# Patient Record
Sex: Male | Born: 1950 | Race: Asian | Hispanic: No | Marital: Married | Smoking: Current every day smoker
Health system: Southern US, Community
[De-identification: ages and names within clinical notes are randomized; demographics above are authoritative.]

## PROBLEM LIST (undated history)

## (undated) DIAGNOSIS — E119 Type 2 diabetes mellitus without complications: Secondary | ICD-10-CM

## (undated) DIAGNOSIS — I10 Essential (primary) hypertension: Secondary | ICD-10-CM

## (undated) DIAGNOSIS — E785 Hyperlipidemia, unspecified: Secondary | ICD-10-CM

## (undated) HISTORY — DX: Hyperlipidemia, unspecified: E78.5

## (undated) HISTORY — DX: Essential (primary) hypertension: I10

---

## 2012-12-13 ENCOUNTER — Other Ambulatory Visit: Payer: Self-pay | Admitting: Infectious Diseases

## 2012-12-13 ENCOUNTER — Ambulatory Visit
Admission: RE | Admit: 2012-12-13 | Discharge: 2012-12-13 | Disposition: A | Discharge status: Home / Self Care | Payer: No Typology Code available for payment source | Source: Ambulatory Visit | Attending: Infectious Diseases | Admitting: Infectious Diseases

## 2012-12-13 DIAGNOSIS — A150 Tuberculosis of lung: Secondary | ICD-10-CM

## 2013-01-10 ENCOUNTER — Ambulatory Visit: Payer: Self-pay | Admitting: Family Medicine

## 2013-01-10 VITALS — BP 148/88 | HR 52 | Temp 97.9°F | Resp 18 | Ht 68.5 in | Wt 168.4 lb

## 2013-01-10 DIAGNOSIS — I10 Essential (primary) hypertension: Secondary | ICD-10-CM

## 2013-01-10 DIAGNOSIS — R51 Headache: Secondary | ICD-10-CM

## 2013-01-10 DIAGNOSIS — E7800 Pure hypercholesterolemia, unspecified: Secondary | ICD-10-CM

## 2013-01-10 LAB — LIPID PANEL
LDL Cholesterol: 163 mg/dL — ABNORMAL HIGH (ref 0–99)
Total CHOL/HDL Ratio: 5.1 Ratio
VLDL: 35 mg/dL (ref 0–40)

## 2013-01-10 LAB — POCT CBC
Granulocyte percent: 47.6 %G (ref 37–80)
HCT, POC: 45.5 % (ref 43.5–53.7)
MCV: 92.8 fL (ref 80–97)
POC LYMPH PERCENT: 42.6 %L (ref 10–50)
RDW, POC: 13.6 %

## 2013-01-10 LAB — BASIC METABOLIC PANEL
CO2: 25 mEq/L (ref 19–32)
Chloride: 105 mEq/L (ref 96–112)
Glucose, Bld: 122 mg/dL — ABNORMAL HIGH (ref 70–99)
Potassium: 4.2 mEq/L (ref 3.5–5.3)
Sodium: 138 mEq/L (ref 135–145)

## 2013-01-10 NOTE — Patient Instructions (Addendum)
I will be in touch with your labs.  Your blood sugar is a little bit elevated today. Let's check this again in about 6 months.    If you need cholesterol medication again I will prescribe it for you.

## 2013-01-10 NOTE — Progress Notes (Signed)
[facility] [address] [place] [address] [phone]  Date:  01/10/2013   Name:  Luis Coleman   DOB:  10-11-1950   MRN:  [id number]  PCP:  No PCP Per Patient    Chief Complaint: Headache, Dizziness and Blurred Vision   History of Present Illness:  Luis Coleman is a 62 y.o. very pleasant male patient who presents with the following:  Here today as a new patient.  History of HTN and high cholesterol.  He has noted HA, dizziness and blurred vision for about 2 weeks.  He notes the blurred vision "just sometimes," he "has not really paid attention to it."   He notes the HA for a couple of minutes a day, it may occur every couple of days.  The dizziness seems to occur along with his headaches, and only lasts a couple of minutes.  No syncope.  He thinks these sx may be due to high cholesterol, as when he noted sx like this in the past they resolved with chl treatment.  No CP or SOB.    Luis Coleman is here with his son in law who interprets for him today.    He also notes pain in his left lower back which has been present for a couple of months.  No known injury.    [name] is actually still taking his BP medications.  He wants to check his cholesterol today.   He has just moved here from [place], and his medications were rx there.  He is on plavix, but is not sure why.  He denies any history of CVA or CAD, MI  He is fasting today for labs.     There are no active problems to display for this patient.   Past Medical History  Diagnosis Date  . Hypertension   . Hyperlipidemia     History reviewed. No pertinent past surgical history.  History  Substance Use Topics  . Smoking status: Former Smoker -- 20 years  . Smokeless tobacco: Not on file  . Alcohol Use: No    History reviewed. No pertinent family history.  No Known Allergies  Medication list has been reviewed and updated.  No current outpatient prescriptions on file prior to visit.   No  current facility-administered medications on file prior to visit.    Review of Systems:  As per HPI- otherwise negative.   Physical Examination: Filed Vitals:   01/10/13 1052  BP: 132/74  Pulse: 54  Temp: 97.9 F (36.6 C)  Resp: 18   Filed Vitals:   01/10/13 1052  Height: 5' 8.5" (1.74 m)  Weight: 168 lb 6.4 oz (76.386 kg)   Body mass index is 25.23 kg/(m^2). Ideal Body Weight: Weight in (lb) to have BMI = 25: 166.5  GEN: WDWN, NAD, Non-toxic, A & O x 3, speaks [place] looks well HEENT: Atraumatic, Normocephalic. Neck supple. No masses, No LAD.  Bilateral TM wnl, oropharynx normal.  PEERL,EOMI.   Ears and Nose: No external deformity. CV: RRR, No M/G/R. No JVD. No thrill. No extra heart sounds. PULM: CTA B, no wheezes, crackles, rhonchi. No retractions. No resp. distress. No accessory muscle use. ABD: S, NT, ND, +BS. No rebound. No HSM. EXTR: No c/c/e NEURO Normal gait.  PSYCH: Normally interactive. Conversant. Not depressed or anxious appearing.  Calm demeanor.   No orthostasis.  He is bradycardic in the 50's.    Results for orders placed in visit on 01/10/13  POCT CBC  Result Value Range   WBC 6.6  4.6 - 10.2 K/uL   Lymph, poc 2.8  0.6 - 3.4   POC LYMPH PERCENT 42.6  10 - 50 %L   MID (cbc) 0.6  0 - 0.9   POC MID % 9.8  0 - 12 %M   POC Granulocyte 3.1  2 - 6.9   Granulocyte percent 47.6  37 - 80 %G   RBC 4.90  4.69 - 6.13 M/uL   Hemoglobin 15.3  14.1 - 18.1 g/dL   HCT, POC 16.1  09.6 - 53.7 %   MCV 92.8  80 - 97 fL   MCH, POC 31.2  27 - 31.2 pg   MCHC 33.6  31.8 - 35.4 g/dL   RDW, POC 04.5     Platelet Count, POC 300  142 - 424 K/uL   MPV 8.1  0 - 99.8 fL  GLUCOSE, POCT (MANUAL RESULT ENTRY)      Result Value Range   POC Glucose 110 (*) 70 - 99 mg/dl   Had him perform 15 squats, pulse came up to 70 BPM Assessment and Plan: HTN (hypertension) - Plan: POCT glucose (manual entry), Basic metabolic panel  Headache(784.0) - Plan: POCT CBC  High  cholesterol - Plan: Lipid panel  BP is controlled.  He is on a low dose of BB, and his pulse does respond to exercise.  Continue current BP medications.   Check FLP and other labs as above.   Notified that his glucose is a little high, needs to be watched.   If his sx change or get worse he is to let me know.  Otherwise his sx are stable and mild, and last only a few moments each day.  His main concern today is getting his CHL checked.    Signed Abbe Amsterdam, MD

## 2013-01-13 ENCOUNTER — Telehealth: Payer: Self-pay

## 2013-01-13 DIAGNOSIS — E7800 Pure hypercholesterolemia, unspecified: Secondary | ICD-10-CM

## 2013-01-13 NOTE — Telephone Encounter (Signed)
Patient's son Elenore Paddy is calling to get father's test results.  [phone].

## 2013-01-14 NOTE — Telephone Encounter (Signed)
Called him back- no answer.  LMOM- yes, CHl is high, meds would be helpful.  Will try back another time

## 2013-01-16 ENCOUNTER — Encounter: Payer: Self-pay | Admitting: Family Medicine

## 2013-01-16 MED ORDER — PRAVASTATIN SODIUM 40 MG PO TABS: 40.0000 mg | ORAL_TABLET | Freq: Every day | ORAL | Status: DC

## 2013-01-16 NOTE — Addendum Note (Signed)
Addended by: Abbe Amsterdam C on: 01/16/2013 03:31 PM   Modules accepted: Orders

## 2013-01-16 NOTE — Telephone Encounter (Signed)
Called- again no answer. Will rx pravachol for [facility]john- please come in for a lab visit only for an A1c and LFT panel at their convenience.  Plan to recheck CHL in a few months

## 2013-01-17 ENCOUNTER — Telehealth: Payer: Self-pay

## 2013-01-17 DIAGNOSIS — I10 Essential (primary) hypertension: Secondary | ICD-10-CM

## 2013-01-17 MED ORDER — ATENOLOL 25 MG PO TABS: 25.0000 mg | ORAL_TABLET | Freq: Every day | ORAL | Status: DC

## 2013-01-17 MED ORDER — LOSARTAN POTASSIUM 100 MG PO TABS: 100.0000 mg | ORAL_TABLET | Freq: Every day | ORAL | Status: DC

## 2013-01-17 NOTE — Telephone Encounter (Signed)
He was to continue current medications. These were from [place] he is almost out of these. Pended please review and advise.

## 2013-01-17 NOTE — Telephone Encounter (Signed)
TOAN STATES HIS FATHER HAVE BEEN DIAGNOSED WITH HBP AND HE ISN'T SURE WHAT MEDICINE DR Patsy Lager WANTS HIM TO GO ON. PLEASE CALL [phone]     [facility] ON [address]

## 2014-01-21 ENCOUNTER — Encounter: Payer: Self-pay | Admitting: Family Medicine

## 2015-10-19 ENCOUNTER — Ambulatory Visit (INDEPENDENT_AMBULATORY_CARE_PROVIDER_SITE_OTHER): Payer: Self-pay | Admitting: Family Medicine

## 2015-10-19 VITALS — BP 138/74 | HR 61 | Temp 98.0°F | Resp 17 | Ht 68.0 in | Wt 161.0 lb

## 2015-10-19 DIAGNOSIS — E119 Type 2 diabetes mellitus without complications: Secondary | ICD-10-CM

## 2015-10-19 DIAGNOSIS — H8112 Benign paroxysmal vertigo, left ear: Secondary | ICD-10-CM

## 2015-10-19 DIAGNOSIS — S0990XS Unspecified injury of head, sequela: Secondary | ICD-10-CM

## 2015-10-19 DIAGNOSIS — H538 Other visual disturbances: Secondary | ICD-10-CM

## 2015-10-19 DIAGNOSIS — I160 Hypertensive urgency: Secondary | ICD-10-CM

## 2015-10-19 DIAGNOSIS — E785 Hyperlipidemia, unspecified: Secondary | ICD-10-CM | POA: Insufficient documentation

## 2015-10-19 DIAGNOSIS — I10 Essential (primary) hypertension: Secondary | ICD-10-CM

## 2015-10-19 DIAGNOSIS — H532 Diplopia: Secondary | ICD-10-CM

## 2015-10-19 LAB — POCT CBC
GRANULOCYTE PERCENT: 55.1 % (ref 37–80)
HEMATOCRIT: 47.3 % (ref 43.5–53.7)
Hemoglobin: 16.4 g/dL (ref 14.1–18.1)
Lymph, poc: 2.8 (ref 0.6–3.4)
MCH: 30.8 pg (ref 27–31.2)
MCHC: 34.6 g/dL (ref 31.8–35.4)
MCV: 89 fL (ref 80–97)
MID (CBC): 0.8 (ref 0–0.9)
MPV: 6.4 fL (ref 0–99.8)
POC GRANULOCYTE: 4.4 (ref 2–6.9)
POC LYMPH PERCENT: 34.9 %L (ref 10–50)
POC MID %: 10 % (ref 0–12)
Platelet Count, POC: 291 10*3/uL (ref 142–424)
RBC: 5.31 M/uL (ref 4.69–6.13)
RDW, POC: 13.6 %
WBC: 7.9 10*3/uL (ref 4.6–10.2)

## 2015-10-19 LAB — POCT GLYCOSYLATED HEMOGLOBIN (HGB A1C): HEMOGLOBIN A1C: 6.7

## 2015-10-19 LAB — POCT URINALYSIS DIP (MANUAL ENTRY)
BILIRUBIN UA: NEGATIVE
BILIRUBIN UA: NEGATIVE
Blood, UA: NEGATIVE
LEUKOCYTES UA: NEGATIVE
NITRITE UA: NEGATIVE
Protein Ur, POC: NEGATIVE
Spec Grav, UA: 1.015
Urobilinogen, UA: 0.2
pH, UA: 6.5

## 2015-10-19 LAB — POCT SEDIMENTATION RATE: POCT SED RATE: 10 mm/h (ref 0–22)

## 2015-10-19 LAB — GLUCOSE, POCT (MANUAL RESULT ENTRY): POC Glucose: 136 mg/dl — AB (ref 70–99)

## 2015-10-19 MED ORDER — ATORVASTATIN CALCIUM 20 MG PO TABS: 20.0000 mg | ORAL_TABLET | Freq: Every day | ORAL | Status: AC

## 2015-10-19 MED ORDER — ATENOLOL 25 MG PO TABS
12.5000 mg | ORAL_TABLET | Freq: Two times a day (BID) | ORAL | Status: AC
Start: 2015-10-19 — End: ?

## 2015-10-19 MED ORDER — METFORMIN HCL 850 MG PO TABS: 850.0000 mg | ORAL_TABLET | Freq: Every day | ORAL | Status: AC

## 2015-10-19 MED ORDER — LOSARTAN POTASSIUM 50 MG PO TABS: 50.0000 mg | ORAL_TABLET | Freq: Two times a day (BID) | ORAL | Status: AC
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
## 2015-10-19 NOTE — Patient Instructions (Addendum)
Try the Epley manuvers at home - look up demonstration on you tube.   If they doesn't help and the dizziness/room spinning is worsening, you can try meclizine (over-the-counter medication to temporarily relieve symptoms)    IF you received an x-ray today, you will receive an invoice from [facility]. Please contact [facility] at [phone] with questions or concerns regarding your invoice.   IF you received labwork today, you will receive an invoice from [employer]. Please contact [facility] at [phone] with questions or concerns regarding your invoice.   Our billing staff will not be able to assist you with questions regarding bills from these companies.  You will be contacted with the lab results as soon as they are available. The fastest way to get your results is to activate your My Chart account. Instructions are located on the last page of this paperwork. If you have not heard from [place] regarding the results in 2 weeks, please contact this office.    [facility] [address], [place], [place] [address] [address] [place], [place], [place] [address]   Blurred Vision Having blurred vision means that you cannot see things clearly. Your vision may seem fuzzy or out of focus. Blurred vision is a very common symptom of an eye or vision problem. Blurred vision is often a gradual blur that occurs in one eye or both eyes. There are many causes of blurred vision, including cataracts, macular degeneration, and diabetic retinopathy. Blurred vision can be diagnosed based on your symptoms and a physical exam. Tell your health care provider about any other health problems you have, any recent eye injury, and any prior surgeries. You may need to see a health care provider who specializes in eye problems (ophthalmologist). Your treatment depends on what is causing your blurred vision.  HOME CARE INSTRUCTIONS 1. Tell your health care provider about any  changes in your blurred vision. 2. Do not drive or operate heavy machinery if your vision is blurry. 3. Keep all follow-up visits as directed by your health care provider. This is important. SEEK MEDICAL CARE IF:  Your symptoms get worse.  You have new symptoms.  You have trouble seeing at night.  You have trouble seeing up close or far away.  You have trouble noticing the difference between colors. SEEK IMMEDIATE MEDICAL CARE IF:  You have severe eye pain.  You have a severe headache.  You have flashing lights in your field of vision.  You have a sudden change in vision.  You have a sudden loss of vision.  You have vision change after an injury.  You notice drainage coming from your eyes.  You notice a rash around your eyes.   This information is not intended to replace advice given to you by your health care provider. Make sure you discuss any questions you have with your health care provider.   Document Released: 06/26/2003 Document Revised: 11/07/2014 Document Reviewed: 05/17/2014 Elsevier Interactive Patient Education 2016 Elsevier Inc. Benign Positional Vertigo Vertigo is the feeling that you or your surroundings are moving when they are not. Benign positional vertigo is the most common form of vertigo. The cause of this condition is not serious (is benign). This condition is triggered by certain movements and positions (is positional). This condition can be dangerous if it occurs while you are doing something that could endanger you or others, such as driving.  CAUSES In many cases, the cause of this condition is not known. It may be  caused by a disturbance in an area of the inner ear that helps your brain to sense movement and balance. This disturbance can be caused by a viral infection (labyrinthitis), head injury, or repetitive motion. RISK FACTORS This condition is more likely to develop in: 4. Women. 5. People who are 65 years of age or  older. SYMPTOMS Symptoms of this condition usually happen when you move your head or your eyes in different directions. Symptoms may start suddenly, and they usually last for less than a minute. Symptoms may include:  Loss of balance and falling.  Feeling like you are spinning or moving.  Feeling like your surroundings are spinning or moving.  Nausea and vomiting.  Blurred vision.  Dizziness.  Involuntary eye movement (nystagmus). Symptoms can be mild and cause only slight annoyance, or they can be severe and interfere with daily life. Episodes of benign positional vertigo may return (recur) over time, and they may be triggered by certain movements. Symptoms may improve over time. DIAGNOSIS This condition is usually diagnosed by medical history and a physical exam of the head, neck, and ears. You may be referred to a health care provider who specializes in ear, nose, and throat (ENT) problems (otolaryngologist) or a provider who specializes in disorders of the nervous system (neurologist). You may have additional testing, including:  MRI.  A CT scan.  Eye movement tests. Your health care provider may ask you to change positions quickly while he or she watches you for symptoms of benign positional vertigo, such as nystagmus. Eye movement may be tested with an electronystagmogram (ENG), caloric stimulation, the Dix-Hallpike test, or the roll test.  An electroencephalogram (EEG). This records electrical activity in your brain.  Hearing tests. TREATMENT Usually, your health care provider will treat this by moving your head in specific positions to adjust your inner ear back to normal. Surgery may be needed in severe cases, but this is rare. In some cases, benign positional vertigo may resolve on its own in 2-4 weeks. HOME CARE INSTRUCTIONS Safety  Move slowly.Avoid sudden body or head movements.  Avoid driving.  Avoid operating heavy machinery.  Avoid doing any tasks that would  be dangerous to you or others if a vertigo episode would occur.  If you have trouble walking or keeping your balance, try using a cane for stability. If you feel dizzy or unstable, sit down right away.  Return to your normal activities as told by your health care provider. Ask your health care provider what activities are safe for you. General Instructions  Take over-the-counter and prescription medicines only as told by your health care provider.  Avoid certain positions or movements as told by your health care provider.  Drink enough fluid to keep your urine clear or pale yellow.  Keep all follow-up visits as told by your health care provider. This is important. SEEK MEDICAL CARE IF:  You have a fever.  Your condition gets worse or you develop new symptoms.  Your family or friends notice any behavioral changes.  Your nausea or vomiting gets worse.  You have numbness or a "pins and needles" sensation. SEEK IMMEDIATE MEDICAL CARE IF:  You have difficulty speaking or moving.  You are always dizzy.  You faint.  You develop severe headaches.  You have weakness in your legs or arms.  You have changes in your hearing or vision.  You develop a stiff neck.  You develop sensitivity to light.   This information is not intended to replace advice  given to you by your health care provider. Make sure you discuss any questions you have with your health care provider.   Document Released: 03/31/2006 Document Revised: 03/14/2015 Document Reviewed: 10/16/2014 Elsevier Interactive Patient Education 2016 [employer]. Teaching laboratory technician Self-Care WHAT IS THE EPLEY MANEUVER? The Epley maneuver is an exercise you can do to relieve symptoms of benign paroxysmal positional vertigo (BPPV). This condition is often just referred to as vertigo. BPPV is caused by the movement of tiny crystals (canaliths) inside your inner ear. The accumulation and movement of canaliths in your inner ear causes a  sudden spinning sensation (vertigo) when you move your head to certain positions. Vertigo usually lasts about 30 seconds. BPPV usually occurs in just one ear. If you get vertigo when you lie on your left side, you probably have BPPV in your left ear. Your health care provider can tell you which ear is involved.  BPPV may be caused by a head injury. Many people older than 50 get BPPV for unknown reasons. If you have been diagnosed with BPPV, your health care provider may teach you how to do this maneuver. BPPV is not life threatening (benign) and usually goes away in time.  WHEN SHOULD I PERFORM THE EPLEY MANEUVER? You can do this maneuver at home whenever you have symptoms of vertigo. You may do the Epley maneuver up to 3 times a day until your symptoms of vertigo go away. HOW SHOULD I DO THE EPLEY MANEUVER? 6. Sit on the edge of a bed or table with your back straight. Your legs should be extended or hanging over the edge of the bed or table.  7. Turn your head halfway toward the affected ear.  8. Lie backward quickly with your head turned until you are lying flat on your back. You may want to position a pillow under your shoulders.  9. Hold this position for 30 seconds. You may experience an attack of vertigo. This is normal. Hold this position until the vertigo stops. 10. Then turn your head to the opposite direction until your unaffected ear is facing the floor.  11. Hold this position for 30 seconds. You may experience an attack of vertigo. This is normal. Hold this position until the vertigo stops. 12. Now turn your whole body to the same side as your head. Hold for another 30 seconds.  13. You can then sit back up. ARE THERE RISKS TO THIS MANEUVER? In some cases, you may have other symptoms (such as changes in your vision, weakness, or numbness). If you have these symptoms, stop doing the maneuver and call your health care provider. Even if doing these maneuvers relieves your vertigo, you  may still have dizziness. Dizziness is the sensation of light-headedness but without the sensation of movement. Even though the Epley maneuver may relieve your vertigo, it is possible that your symptoms will return within 5 years. WHAT SHOULD I DO AFTER THIS MANEUVER? After doing the Epley maneuver, you can return to your normal activities. Ask your doctor if there is anything you should do at home to prevent vertigo. This may include:  Sleeping with two or more pillows to keep your head elevated.  Not sleeping on the side of your affected ear.  Getting up slowly from bed.  Avoiding sudden movements during the day.  Avoiding extreme head movement, like looking up or bending over.  Wearing a cervical collar to prevent sudden head movements. WHAT SHOULD I DO IF MY SYMPTOMS GET WORSE? Call your  health care provider if your vertigo gets worse. Call your provider right way if you have other symptoms, including:   Nausea.  Vomiting.  Headache.  Weakness.  Numbness.  Vision changes.   This information is not intended to replace advice given to you by your health care provider. Make sure you discuss any questions you have with your health care provider.   Document Released: 06/28/2013 Document Reviewed: 06/28/2013 Elsevier Interactive Patient Education [employer].

## 2017-01-01 ENCOUNTER — Emergency Department (HOSPITAL_COMMUNITY)
Admission: EM | Admit: 2017-01-01 | Discharge: 2017-01-02 | Disposition: A | Discharge status: Home / Self Care | Payer: BLUE CROSS/BLUE SHIELD | Attending: Emergency Medicine | Admitting: Emergency Medicine

## 2017-01-01 ENCOUNTER — Encounter (HOSPITAL_COMMUNITY): Payer: Self-pay | Admitting: Emergency Medicine

## 2017-01-01 ENCOUNTER — Emergency Department (HOSPITAL_COMMUNITY): Payer: BLUE CROSS/BLUE SHIELD

## 2017-01-01 DIAGNOSIS — G510 Bell's palsy: Secondary | ICD-10-CM | POA: Diagnosis not present

## 2017-01-01 DIAGNOSIS — Z79899 Other long term (current) drug therapy: Secondary | ICD-10-CM | POA: Insufficient documentation

## 2017-01-01 DIAGNOSIS — R791 Abnormal coagulation profile: Secondary | ICD-10-CM | POA: Diagnosis not present

## 2017-01-01 DIAGNOSIS — F17200 Nicotine dependence, unspecified, uncomplicated: Secondary | ICD-10-CM | POA: Diagnosis not present

## 2017-01-01 DIAGNOSIS — R29810 Facial weakness: Secondary | ICD-10-CM | POA: Diagnosis present

## 2017-01-01 DIAGNOSIS — I10 Essential (primary) hypertension: Secondary | ICD-10-CM | POA: Insufficient documentation

## 2017-01-01 DIAGNOSIS — E119 Type 2 diabetes mellitus without complications: Secondary | ICD-10-CM | POA: Diagnosis not present

## 2017-01-01 HISTORY — DX: Type 2 diabetes mellitus without complications: E11.9

## 2017-01-01 LAB — POCT I-STAT TROPONIN I: Troponin i, poc: 0.01 ng/mL (ref 0.00–0.08)

## 2017-01-01 LAB — COMPREHENSIVE METABOLIC PANEL
ALBUMIN: 4.3 g/dL (ref 3.5–5.0)
ALT: 22 U/L (ref 17–63)
AST: 26 U/L (ref 15–41)
Alkaline Phosphatase: 54 U/L (ref 38–126)
Anion gap: 9 (ref 5–15)
BILIRUBIN TOTAL: 0.8 mg/dL (ref 0.3–1.2)
BUN: 16 mg/dL (ref 6–20)
CHLORIDE: 105 mmol/L (ref 101–111)
CO2: 22 mmol/L (ref 22–32)
CREATININE: 0.84 mg/dL (ref 0.61–1.24)
Calcium: 8.9 mg/dL (ref 8.9–10.3)
GFR calc Af Amer: >60 mL/min (ref >60)
GLUCOSE: 121 mg/dL — AB (ref 65–99)
Potassium: 3.6 mmol/L (ref 3.5–5.1)
Sodium: 136 mmol/L (ref 135–145)
TOTAL PROTEIN: 7.4 g/dL (ref 6.5–8.1)

## 2017-01-01 LAB — POCT I-STAT, CHEM 8
BUN: 16 mg/dL (ref 6–20)
CREATININE: 0.8 mg/dL (ref 0.61–1.24)
Calcium, Ion: 1.1 mmol/L — ABNORMAL LOW (ref 1.15–1.40)
Chloride: 103 mmol/L (ref 101–111)
GLUCOSE: 120 mg/dL — AB (ref 65–99)
HEMATOCRIT: 42 % (ref 39.0–52.0)
Hemoglobin: 14.3 g/dL (ref 13.0–17.0)
POTASSIUM: 3.5 mmol/L (ref 3.5–5.1)
Sodium: 138 mmol/L (ref 135–145)
TCO2: 22 mmol/L (ref 0–100)

## 2017-01-01 LAB — CBC
HEMATOCRIT: 40.5 % (ref 39.0–52.0)
HEMOGLOBIN: 14.7 g/dL (ref 13.0–17.0)
MCH: 30.6 pg (ref 26.0–34.0)
MCHC: 36.3 g/dL — AB (ref 30.0–36.0)
MCV: 84.4 fL (ref 78.0–100.0)
Platelets: 257 10*3/uL (ref 150–400)
RBC: 4.8 MIL/uL (ref 4.22–5.81)
RDW: 13.1 % (ref 11.5–15.5)
WBC: 9.6 10*3/uL (ref 4.0–10.5)

## 2017-01-01 LAB — PROTIME-INR
INR: 1.01
Prothrombin Time: 13.3 seconds (ref 11.4–15.2)

## 2017-01-01 LAB — DIFFERENTIAL
BASOS ABS: 0 10*3/uL (ref 0.0–0.1)
Basophils Relative: 0 %
EOS ABS: 0.3 10*3/uL (ref 0.0–0.7)
Eosinophils Relative: 3 %
LYMPHS ABS: 3.4 10*3/uL (ref 0.7–4.0)
Lymphocytes Relative: 36 %
MONOS PCT: 7 %
Monocytes Absolute: 0.6 10*3/uL (ref 0.1–1.0)
NEUTROS ABS: 5.3 10*3/uL (ref 1.7–7.7)
NEUTROS PCT: 54 %

## 2017-01-01 LAB — CBG MONITORING, ED: Glucose-Capillary: 119 mg/dL — ABNORMAL HIGH (ref 65–99)

## 2017-01-01 LAB — APTT: APTT: 34 s (ref 24–36)

## 2017-01-01 MED ORDER — VALACYCLOVIR HCL 500 MG PO TABS
1000.0000 mg | ORAL_TABLET | Freq: Once | ORAL | Status: AC
  Administered 2017-01-01: 1000 mg via ORAL
  Filled 2017-01-01: qty 2

## 2017-01-01 MED ORDER — ARTIFICIAL TEARS OPHTHALMIC OINT
TOPICAL_OINTMENT | Freq: Once | OPHTHALMIC | Status: AC
  Administered 2017-01-01: via OPHTHALMIC
  Filled 2017-01-01: qty 3.5

## 2017-01-01 MED ORDER — PREDNISONE 20 MG PO TABS
40.0000 mg | ORAL_TABLET | Freq: Once | ORAL | Status: AC
  Administered 2017-01-01: 40 mg via ORAL
  Filled 2017-01-01: qty 2

## 2017-01-01 NOTE — ED Triage Notes (Signed)
Pt woke up this morning with facial numbness and facial droop  Family states pt was fine last night when he went to bed  Family took pt to urgent care this evening and he was sent here to rule out stroke  Pt has no weakness in his extremities and family states pt's speech is clear  Pt does not speak english

## 2017-01-01 NOTE — Discharge Instructions (Addendum)
Take the medications as prescribed. Use artificial tears OTC  in your left eye during the day and use refresh lacri-lube OTC in your left eye with the eye patch to keep your left eye shut while you sleep at night to prevent the covering of your eye from drying out. You can call one of the neurology groups to be rechecked in the next 1-2 weeks. You can also have your eye rechecked by Dr Allena KatzPatel, an ophthalmologist, to make sure your eye isn't getting too dry. Recheck if you get a headache, get numbness or weakness in your arms or legs, have difficulty swallowing or seem worse.     U?ng thu?c theo quy d?nh. S? d?ng nu?c m?t nhn t?o OTC trong m?t tri c?a b?n trong ngy v s? d?ng lm m?i d?u bi tron OTC trong m?t tri c?a b?n v?i mi?ng che m?t d? gi? cho m?t tri c?a b?n dng trong khi b?n ng? vo ban dm d? ngan ch?n che m?t c?a b?n kh?i b? kh. B?n c th? g?i m?t trong cc nhm th?n kinh du?c ki?m tra l?i trong vng 1-2 tu?n t?i. B?n cung c th? ki?m tra m?t c?a bc si Patel, m?t bc si nhn khoa, d? d?m b?o m?t c?a b?n khng b? kh qu. Ki?m tra l?i n?u b?n b? nh?c d?u, b? t ho?c y?u ? cnh tay ho?c chn, kh nu?t ho?c c v? t? hon.

## 2017-01-01 NOTE — ED Notes (Signed)
Pt states that his mouth and eyes were drooping this am, he went to Urgent Care and they sent him here, pt has no other sx

## 2017-01-01 NOTE — ED Provider Notes (Signed)
[facility]-EMERGENCY DEPT Provider Note   CSN: [id number] Arrival date & time: 01/01/17  2119  By signing my name below, I, Thelma Bargeick Cochran, attest that this documentation has been prepared under the direction and in the presence of Devoria AlbeKnapp, Aulton Routt, MD. Electronically Signed: Thelma Bargeick Cochran, Scribe. 01/01/17. 12:07 AM.  Time seen 23:25 PM   History   Chief Complaint Chief Complaint  Patient presents with  . stoke symptoms   The history is provided by a relative. No language interpreter was used.    HPI Comments: Luis Coleman is a 66 y.o. male with PMHx of HTN and HLD who presents to the Emergency Department complaining of constant left-sided facial droop since when he woke up this morning. He notes he can't move his face and has no difficulty with numbness or weakness in his arms or legs. He has associated troubling moving his mouth, some mild, some increased tearing of his left eye, feeling cold, and blurry vision. He denies numbness on his tongue or in his face, difficulty speaking or moving his tongue, difficulty swallowing, ear pain, and eye problems (but notes his left eye lid has some difficulty opening fully). Pt notes he quit smoking some time ago and does not smoke anymore. Pt is retired and visiting from [place] for a few months and visits yearly. He has been here about 2 weeks. His son in law notes he takes Plavix for as a  Preventative for stroke. Pt has no other pertinent medical histories including CAD.   PCP in [place]  Past Medical History:  Diagnosis Date  . Diabetes mellitus without complication (HCC)   . Hyperlipidemia   . Hypertension     Patient Active Problem List   Diagnosis Date Noted  . Hyperlipidemia LDL goal <70 10/19/2015  . Type 2 diabetes mellitus without complication, without long-term current use of insulin (HCC) 10/19/2015  . HTN (hypertension) 01/10/2013    History reviewed. No pertinent surgical history.     Home Medications    Prior to Admission  medications   Medication Sig Start Date End Date Taking? Authorizing Provider  atenolol (TENORMIN) 50 MG tablet Take 12.5 mg by mouth 2 (two) times daily.   Yes [provider]  atorvastatin (LIPITOR) 20 MG tablet Take 1 tablet (20 mg total) by mouth daily. 10/19/15  Yes Sherren MochaShaw, Eva N, MD  clopidogrel (PLAVIX) 75 MG tablet Take 75 mg by mouth daily.   Yes [provider]  losartan (COZAAR) 50 MG tablet Take 1 tablet (50 mg total) by mouth 2 (two) times daily. 10/19/15  Yes Sherren MochaShaw, Eva N, MD  metFORMIN (GLUCOPHAGE) 850 MG tablet Take 1 tablet (850 mg total) by mouth daily with breakfast. 10/19/15  Yes Sherren MochaShaw, Eva N, MD  NON FORMULARY Take 1 tablet by mouth daily. [place] Diabetic Medication Gliclazide 30 mg   Yes [provider]  atenolol (TENORMIN) 25 MG tablet Take 0.5 tablets (12.5 mg total) by mouth 2 (two) times daily. Patient not taking: Reported on 01/01/2017 10/19/15   Sherren MochaShaw, Eva N, MD  predniSONE (DELTASONE) 20 MG tablet Take 2 po QD x 4d then 1 po QD x 4 d 01/02/17   Devoria AlbeKnapp, Boston Catarino, MD  valACYclovir (VALTREX) 1000 MG tablet Take 1 tablet (1,000 mg total) by mouth 3 (three) times daily. 01/02/17   Devoria AlbeKnapp, Kyriaki Moder, MD    Family History History reviewed. No pertinent family history.  Social History Social History  Substance Use Topics  . Smoking status: Current Every Day Smoker  Years: 20.00  . Smokeless tobacco: Never Used  . 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   Review of Systems Review of Systems  Constitutional:       Positive for: feeling cold  HENT: Negative for ear pain and trouble swallowing.   Eyes: Positive for visual disturbance. Negative for pain.  Neurological: Positive for facial asymmetry. Negative for numbness.  All other systems reviewed and are negative.    Physical Exam Updated Vital Signs BP (!) 158/76 (BP Location: Left Arm)   Pulse 60   Temp 98.6 F (37 C)  (Oral)   Resp 14   SpO2 98%   Vital signs normal except for mild hypertension   Physical Exam  Constitutional: He is oriented to person, place, and time. He appears well-developed and well-nourished.  Non-toxic appearance. He does not appear ill. No distress.  HENT:  Head: Normocephalic and atraumatic.  Right Ear: External ear normal.  Left Ear: External ear normal.  Nose: Nose normal. No mucosal edema or rhinorrhea.  Mouth/Throat: Oropharynx is clear and moist and mucous membranes are normal. No dental abscesses or uvula swelling.  Eyes: Conjunctivae and EOM are normal. Pupils are equal, round, and reactive to light.  Neck: Normal range of motion and full passive range of motion without pain. Neck supple.  No bruits   Cardiovascular: Normal rate, regular rhythm and normal heart sounds.  Exam reveals no gallop and no friction rub.   No murmur heard. Pulmonary/Chest: Effort normal and breath sounds normal. No respiratory distress. He has no wheezes. He has no rhonchi. He has no rales. He exhibits no tenderness and no crepitus.  Abdominal: Soft. Normal appearance and bowel sounds are normal. He exhibits no distension. There is no tenderness. There is no rebound and no guarding.  Musculoskeletal: Normal range of motion. He exhibits no edema or tenderness.  Moves all extremities well.   Neurological: He is alert and oriented to person, place, and time. He has normal strength. A cranial nerve deficit is present.  Pt has left-sided facial droop when he smiles He has loss of movement of his left eyebrow Weakness of his left upper eyelid Grips equal No pronator drift No motor weakness  Skin: Skin is warm, dry and intact. No rash noted. No erythema. No pallor.  Psychiatric: He has a normal mood and affect. His speech is normal and behavior is normal. His mood appears not anxious.  Nursing note and vitals reviewed.    ED Treatments / Results  Labs (all labs ordered are listed, but only  abnormal results are displayed) Results for orders placed or performed during the hospital encounter of 01/01/17  Protime-INR  Result Value Ref Range   Prothrombin Time 13.3 11.4 - 15.2 seconds   INR 1.01   APTT  Result Value Ref Range   aPTT 34 24 - 36 seconds  CBC  Result Value Ref Range   WBC 9.6 4.0 - 10.5 K/uL   RBC 4.80 4.22 - 5.81 MIL/uL   Hemoglobin 14.7 13.0 - 17.0 g/dL   HCT 16.1 09.6 - 04.5 %   MCV 84.4 78.0 - 100.0 fL   MCH 30.6 26.0 - 34.0 pg   MCHC 36.3 (H) 30.0 - 36.0 g/dL   RDW 40.9 81.1 - 91.4 %   Platelets 257 150 - 400 K/uL  Differential  Result Value Ref Range   Neutrophils Relative % 54 %  Neutro Abs 5.3 1.7 - 7.7 K/uL   Lymphocytes Relative 36 %   Lymphs Abs 3.4 0.7 - 4.0 K/uL   Monocytes Relative 7 %   Monocytes Absolute 0.6 0.1 - 1.0 K/uL   Eosinophils Relative 3 %   Eosinophils Absolute 0.3 0.0 - 0.7 K/uL   Basophils Relative 0 %   Basophils Absolute 0.0 0.0 - 0.1 K/uL  Comprehensive metabolic panel  Result Value Ref Range   Sodium 136 135 - 145 mmol/L   Potassium 3.6 3.5 - 5.1 mmol/L   Chloride 105 101 - 111 mmol/L   CO2 22 22 - 32 mmol/L   Glucose, Bld 121 (H) 65 - 99 mg/dL   BUN 16 6 - 20 mg/dL   Creatinine, Ser 4.09 0.61 - 1.24 mg/dL   Calcium 8.9 8.9 - 81.1 mg/dL   Total Protein 7.4 6.5 - 8.1 g/dL   Albumin 4.3 3.5 - 5.0 g/dL   AST 26 15 - 41 U/L   ALT 22 17 - 63 U/L   Alkaline Phosphatase 54 38 - 126 U/L   Total Bilirubin 0.8 0.3 - 1.2 mg/dL   GFR calc non Af Amer >60 >60 mL/min   GFR calc Af Amer >60 >60 mL/min   Anion gap 9 5 - 15  CBG monitoring, ED  Result Value Ref Range   Glucose-Capillary 119 (H) 65 - 99 mg/dL   Comment 1 Notify RN   I-STAT, chem 8  Result Value Ref Range   Sodium 138 135 - 145 mmol/L   Potassium 3.5 3.5 - 5.1 mmol/L   Chloride 103 101 - 111 mmol/L   BUN 16 6 - 20 mg/dL   Creatinine, Ser 9.14 0.61 - 1.24 mg/dL   Glucose, Bld 782 (H) 65 - 99 mg/dL   Calcium, Ion 9.56 (L) 1.15 - 1.40 mmol/L   TCO2 22  0 - 100 mmol/L   Hemoglobin 14.3 13.0 - 17.0 g/dL   HCT 21.3 08.6 - 57.8 %  POCT i-Stat troponin I  Result Value Ref Range   Troponin i, poc 0.01 0.00 - 0.08 ng/mL   Comment 3           Laboratory interpretation all normal except mild hyperglycemia    EKG  EKG Interpretation  Date/Time:  Thursday January 01 2017 21:26:12 EDT Ventricular Rate:  66 PR Interval:    QRS Duration: 94 QT Interval:  406 QTC Calculation: 426 R Axis:   69 Text Interpretation:  Sinus rhythm RSR' in V1 or V2, right VCD or RVH Otherwise within normal limits No old tracing to compare Confirmed by Devoria Albe ([id number]) on 01/01/2017 11:35:16 PM       Radiology Ct Head Wo Contrast  Result Date: 01/01/2017 CLINICAL DATA:  Facial numbness and facial droop EXAM: CT HEAD WITHOUT CONTRAST TECHNIQUE: Contiguous axial images were obtained from the base of the skull through the vertex without intravenous contrast. COMPARISON:  None. FINDINGS: Brain: No mass lesion, intraparenchymal hemorrhage or extra-axial collection. No evidence of acute cortical infarct. Brain parenchyma and CSF-containing spaces are normal for age. Vascular: No hyperdense vessel or unexpected calcification. Skull: Normal visualized skull base, calvarium and extracranial soft tissues. Sinuses/Orbits: No sinus fluid levels or advanced mucosal thickening. No mastoid effusion. Normal orbits. IMPRESSION: Normal head CT. Electronically Signed   By: Deatra Robinson M.D.   On: 01/01/2017 21:54    Procedures Procedures (including critical care time)  Medications Ordered in ED Medications  predniSONE (DELTASONE) tablet 40 mg (40 mg  Oral Given 01/01/17 2351)  valACYclovir (VALTREX) tablet 1,000 mg (1,000 mg Oral Given 01/01/17 2351)  artificial tears (LACRILUBE) ophthalmic ointment ( Left Eye Given 01/01/17 2351)     Initial Impression / Assessment and Plan / ED Course  I have reviewed the triage vital signs and the nursing notes.  Pertinent labs & imaging  results that were available during my care of the patient were reviewed by me and considered in my medical decision making (see chart for details).     DIAGNOSTIC STUDIES: Oxygen Saturation is 98% on RA, normal by my interpretation.    COORDINATION OF CARE: 11:40 PM Discussed treatment plan with pt at bedside and pt agreed to plan.  Patient was started on Valtrex and prednisone for his Bell's palsy. We discussed using artificial tears during the day and Lacri-Lube and patching his eye at nighttime to prevent drying of the cornea. [place], I will give them a referral to a local neurologist and ophthalmologist.   Final Clinical Impressions(s) / ED Diagnoses   Final diagnoses:  Bell's palsy    New Prescriptions New Prescriptions   PREDNISONE (DELTASONE) 20 MG TABLET    Take 2 po QD x 4d then 1 po QD x 4 d   VALACYCLOVIR (VALTREX) 1000 MG TABLET    Take 1 tablet (1,000 mg total) by mouth 3 (three) times daily.    Plan discharge  Devoria Albe, MD, FACEP   I personally performed the services described in this documentation, which was scribed in my presence. The recorded information has been reviewed and considered.  Devoria Albe, MD, Concha Pyo, MD 01/02/17 [phone]

## 2017-01-02 MED ORDER — PREDNISONE 20 MG PO TABS: ORAL_TABLET | ORAL | 0 refills | Status: AC

## 2017-01-02 MED ORDER — VALACYCLOVIR HCL 1 G PO TABS: 1000.0000 mg | ORAL_TABLET | Freq: Three times a day (TID) | ORAL | 0 refills | Status: AC

## 2018-12-29 IMAGING — CT CT HEAD W/O CM
3 of 4 series · 15 of 47 positions shown, 18 images · non-contrast
Comparison: None.

CLINICAL DATA: Facial numbness and facial droop

EXAM:
CT HEAD WITHOUT CONTRAST
TECHNIQUE: Contiguous axial images were obtained from the base of the skull
through the vertex without intravenous contrast.

[Series 2: head w/o · axial · non-contrast · 0.43mm/px · z∈[-168,-33]mm · 9 of 33 slices shown, 12 images]
[im 3/33  brain]
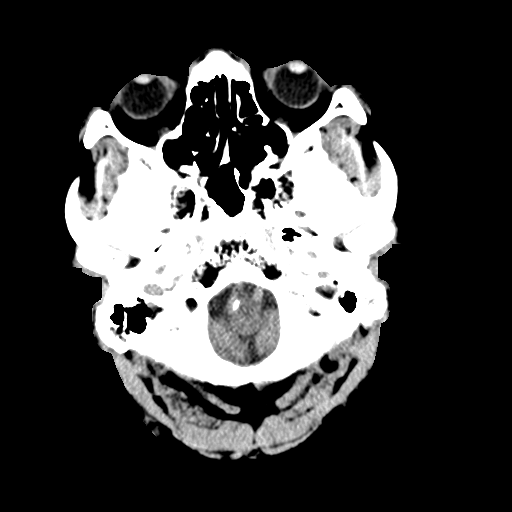
[im 3/33  bone]
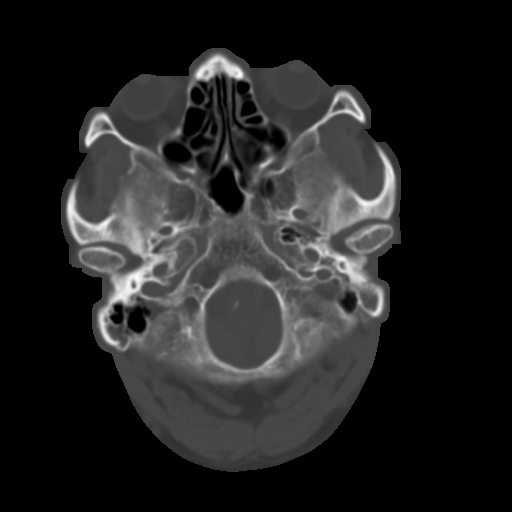
[im 7/33  brain]
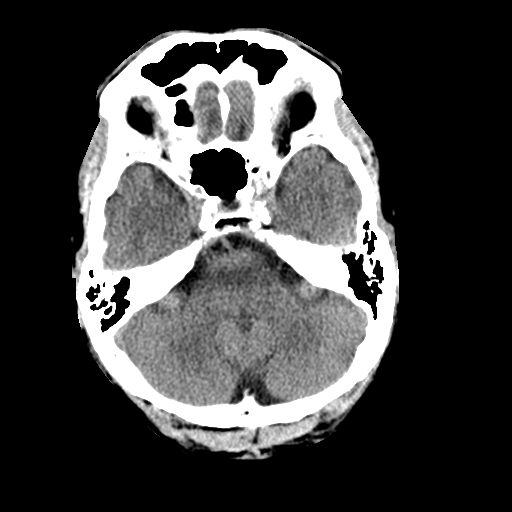
[im 10/33  brain]
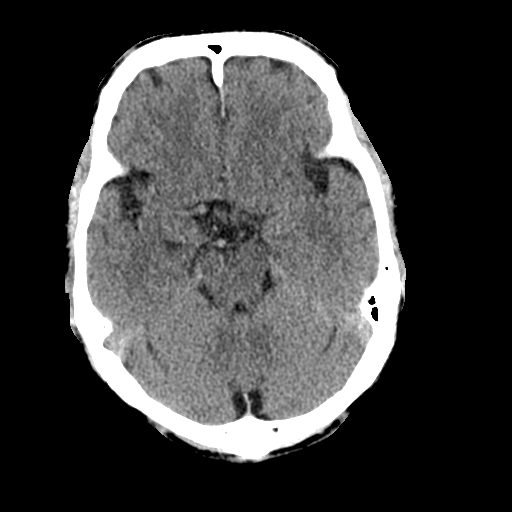
[im 14/33  brain]
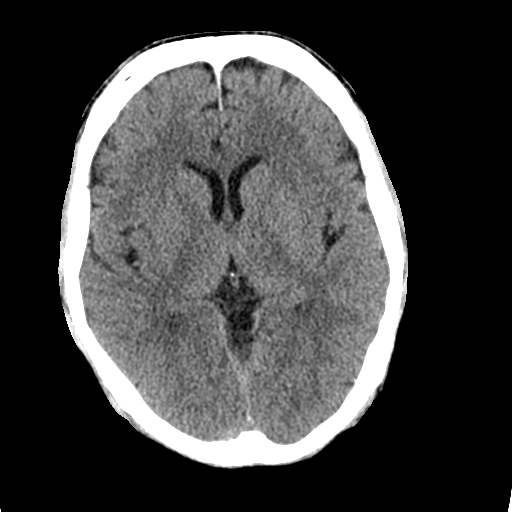
[im 17/33  brain]
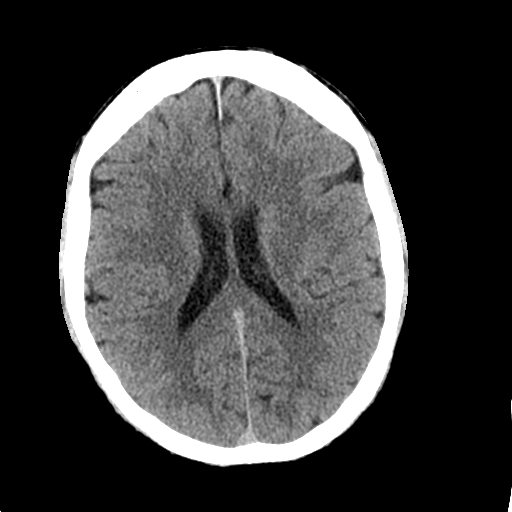
[im 17/33  bone]
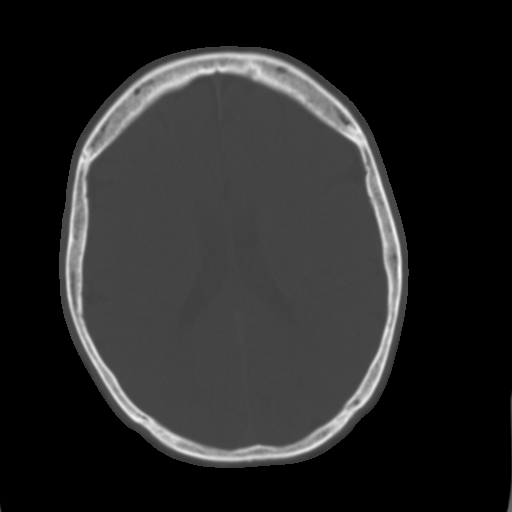
[im 19/33  brain]
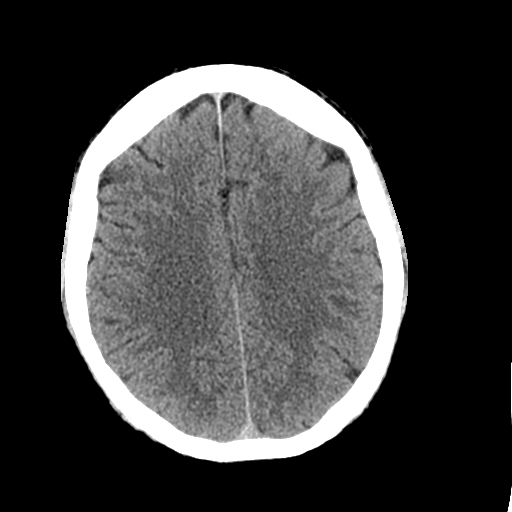
[im 23/33  brain]
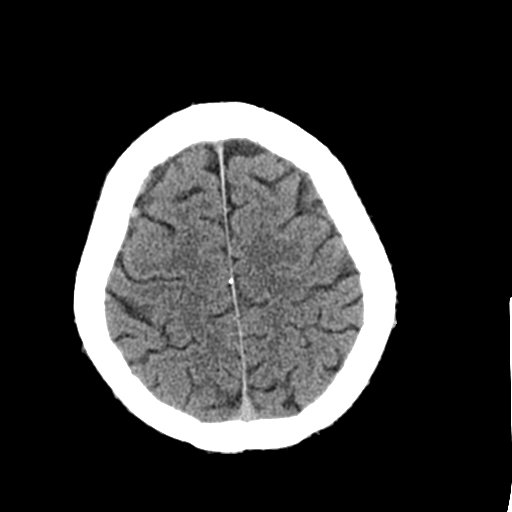
[im 26/33  brain]
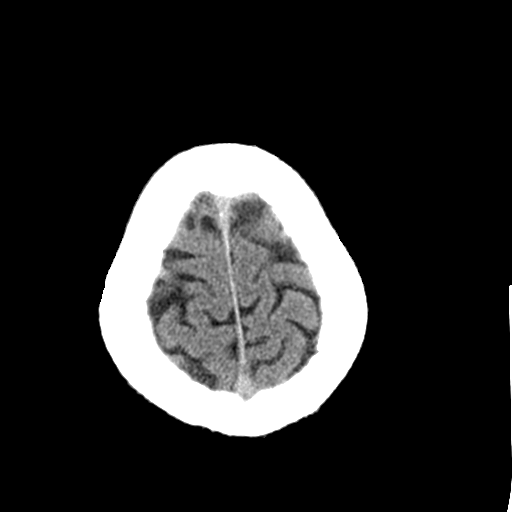
[im 30/33  brain]
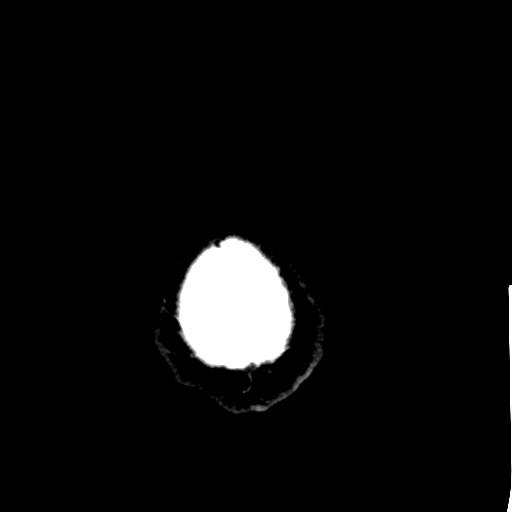
[im 30/33  bone]
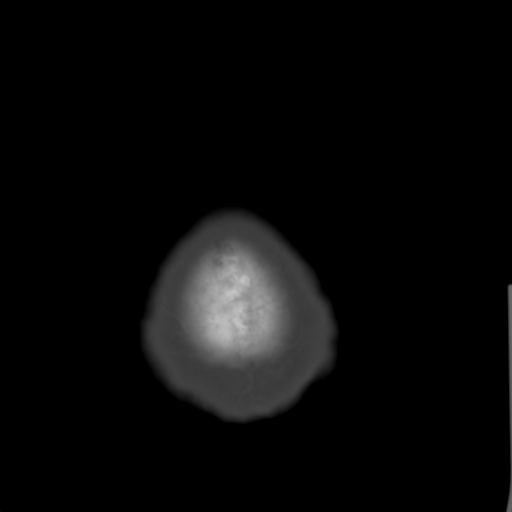

[Series 5: coronal · coronal · 0.30mm/px · 3 of 70 slices shown]
[im 24/70  brain]
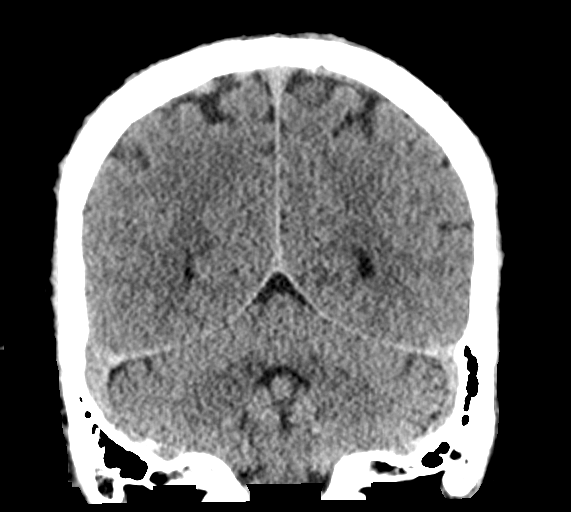
[im 31/70  brain]
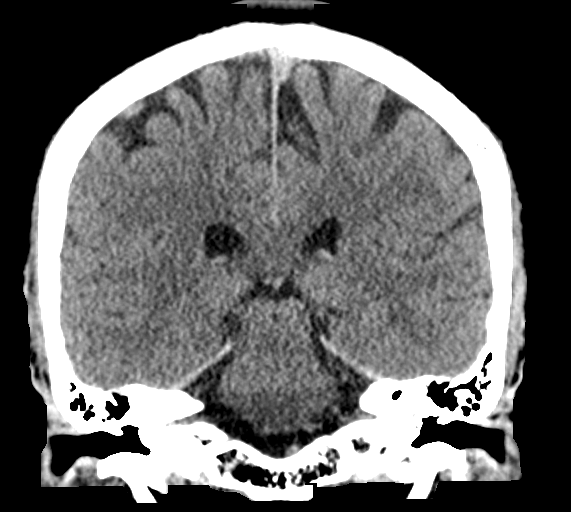
[im 39/70  brain]
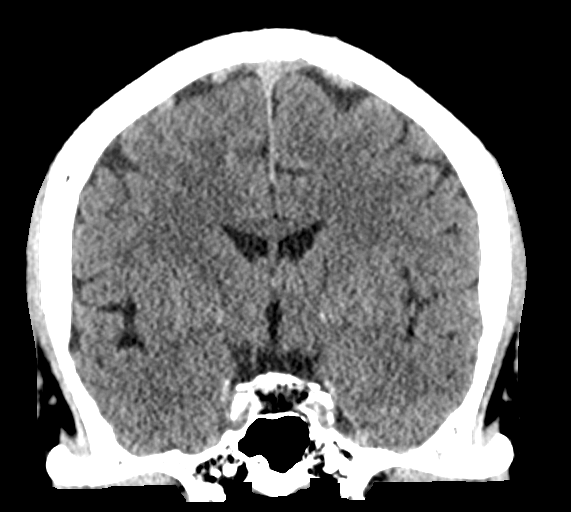

[Series 6: sagittal · sagittal · 0.30mm/px · 3 of 55 slices shown]
[im 19/55  brain]
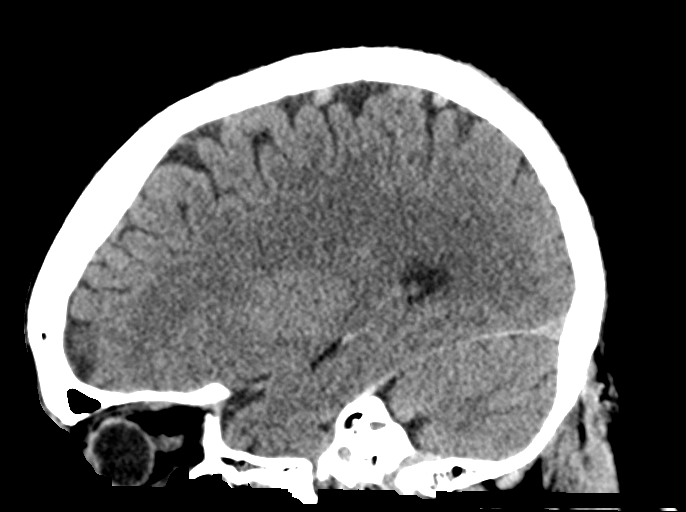
[im 28/55  brain]
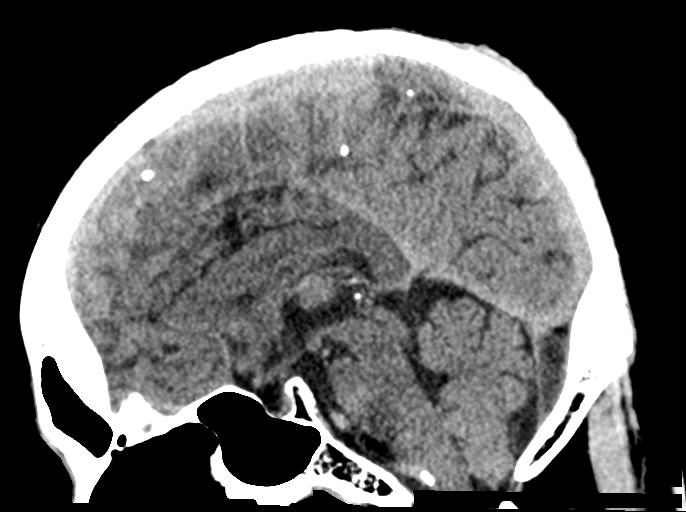
[im 37/55  brain]
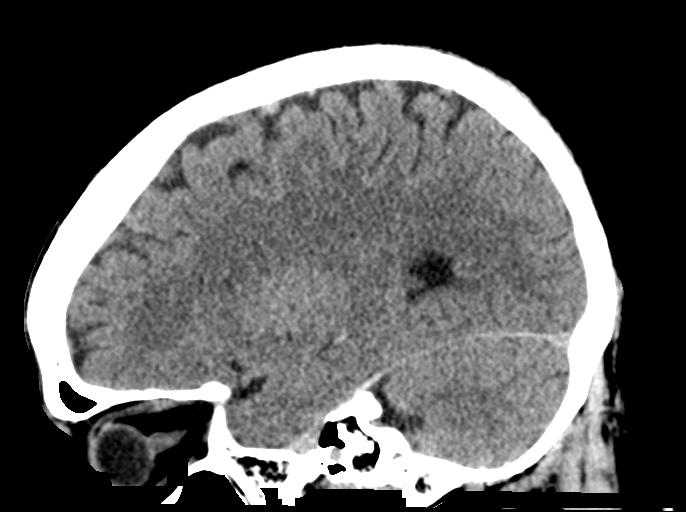

[15 of 47 positions shown; findings below may reference images not displayed]

FINDINGS: Brain: No mass lesion, intraparenchymal hemorrhage or extra-axial
collection. No evidence of acute cortical infarct. Brain parenchyma
and CSF-containing spaces are normal for age.

Vascular: No hyperdense vessel or unexpected calcification.

Skull: Normal visualized skull base, calvarium and extracranial soft
tissues.

Sinuses/Orbits: No sinus fluid levels or advanced mucosal
thickening. No mastoid effusion. Normal orbits.
IMPRESSION: Normal head CT.

## 2019-09-04 ENCOUNTER — Ambulatory Visit: Payer: 59 | Attending: Internal Medicine

## 2019-09-04 DIAGNOSIS — Z23 Encounter for immunization: Secondary | ICD-10-CM | POA: Insufficient documentation

## 2019-09-04 NOTE — Progress Notes (Signed)
   [facility]  Name:  Luis Coleman    MRN: [id number] DOB: 25-May-1951  09/04/2019  [name] was observed post Covid-19 immunization for 15 minutes without incidence. He was provided with Vaccine Information Sheet and instruction to access the V-Safe system.   [name] was instructed to call [phone] with any severe reactions post vaccine: Marland Kitchen Difficulty breathing  . Swelling of your face and throat  . A fast heartbeat  . A bad rash all over your body  . Dizziness and weakness    Immunizations Administered    Name Date Dose VIS Date Route   Pfizer COVID-19 Vaccine 09/04/2019  4:10 PM 0.3 mL 06/17/2019 Intramuscular   Manufacturer: [employer], [employer]   Lot: [id number]   NDC: [address]-8

## 2019-10-04 ENCOUNTER — Ambulatory Visit: Payer: 59 | Attending: Internal Medicine

## 2019-10-04 DIAGNOSIS — Z23 Encounter for immunization: Secondary | ICD-10-CM
